# Patient Record
Sex: Male | Born: 1995 | Race: White | Hispanic: No | Marital: Single
Health system: Southern US, Community
[De-identification: ages and names within clinical notes are randomized; demographics above are authoritative.]

---
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
## 2014-05-28 NOTE — Consult Note (Signed)
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   Patient was seen by consultant.   Orders entered.   Comments: Pt was becoming very agitated and threatening in ED. Show of force was called as he was trying to punch the glass door. He was not redirectable. he stated that he wants to run. He did not show any insight into his behavior.   He was given Geodon 20mg  IM STAT and Benadryl 25mg  IM  STAT.  Electronic Signatures: Rhunette CroftFaheem, Kieu Quiggle S (MD)  (Signed 06-Feb-14 11:04)  Authored: Brief Consult Note   Last Updated: 06-Feb-14 11:04 by Rhunette CroftFaheem, Adelia Baptista S (MD)

## 2015-01-18 IMAGING — CR DG CHEST 2V
1 series · 2 of 2 positions shown · non-contrast
Comparison: none

REASON FOR EXAM: right clavicle edema
COMMENTS:

[Series 1: w chest pa · 0.14mm/px · 2 of 2 slices shown]
[im 1/2]
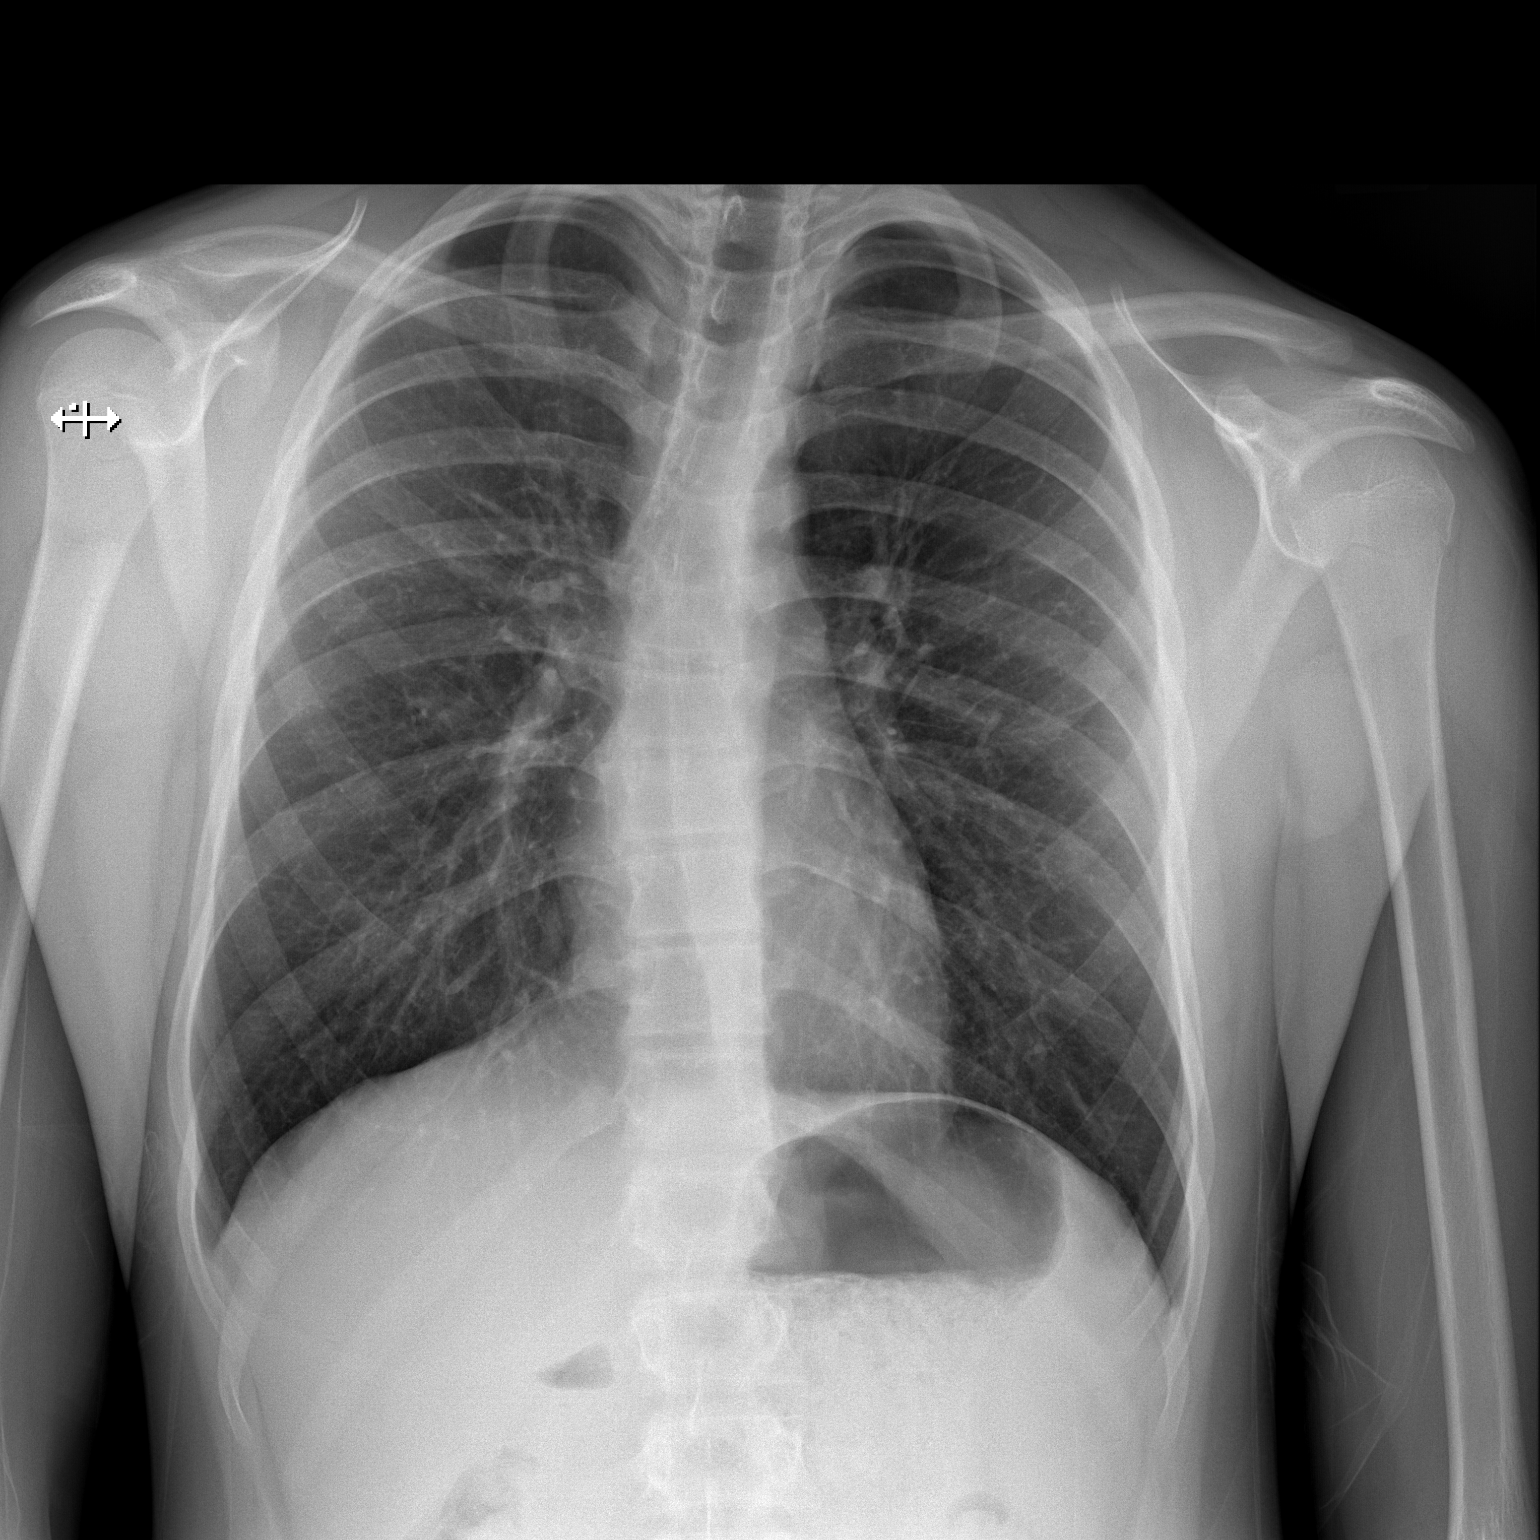
[im 2/2]
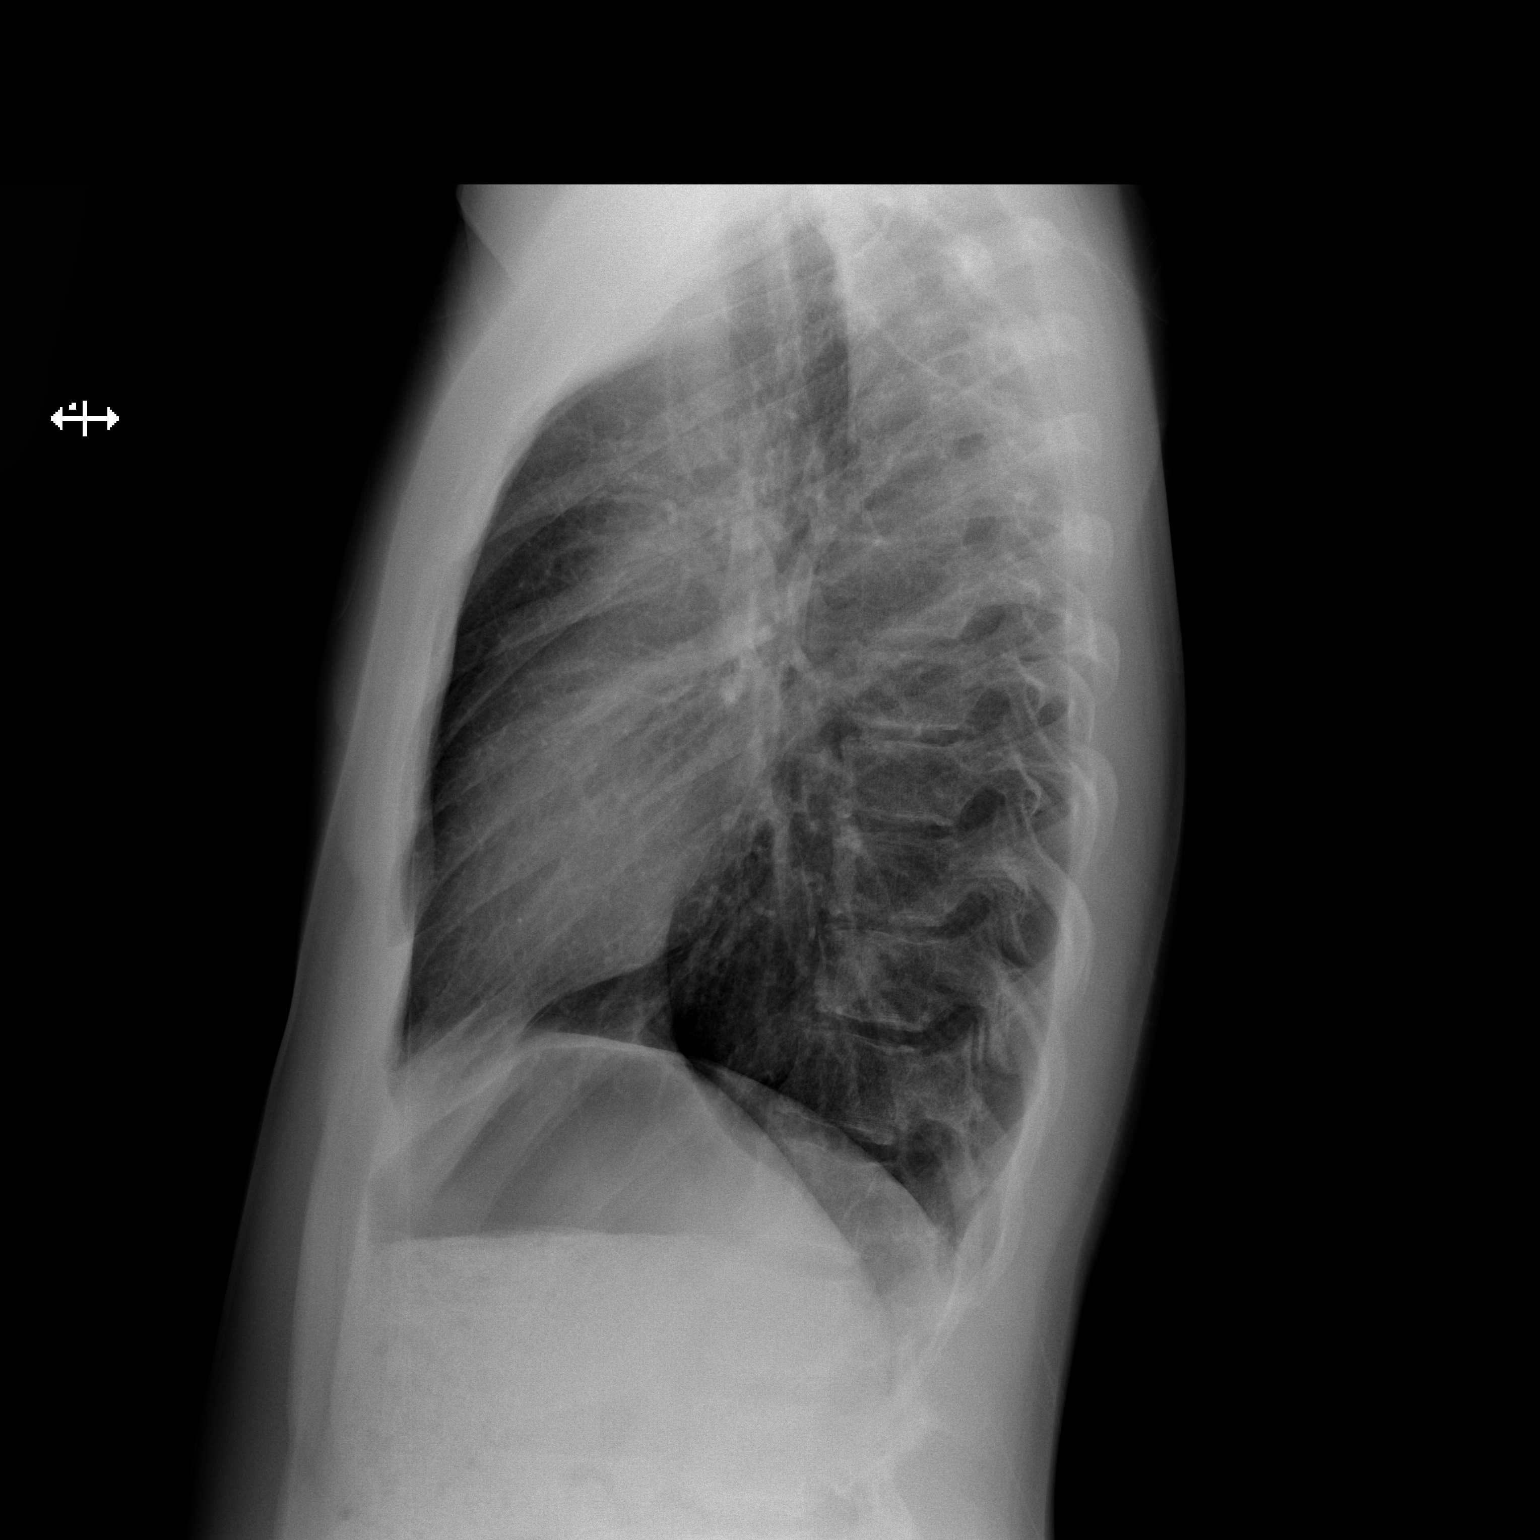

[2 of 2 positions shown; findings below may reference images not displayed]

PROCEDURE:     DXR - DXR CHEST PA (OR AP) AND LATERAL  - March 13, 2012  [DATE]

RESULT:     The lungs are mildly hyperinflated. The perihilar lung markings
are coarse. There is no alveolar infiltrate or pleural effusion. The cardiac
silhouette is normal in size. The bony thorax exhibits no acute abnormality.
Specific attention to the right clavicle reveals no acute abnormality.
IMPRESSION: There is mild hyperinflation which may be voluntary or
could reflect underlying reactive airway disease. I cannot exclude acute
bronchitis in the appropriate clinical setting.

[REDACTED]
